# Patient Record
Sex: Female | Born: 1995
Health system: Southern US, Community
[De-identification: ages and names within clinical notes are randomized; demographics above are authoritative.]

## PROBLEM LIST (undated history)

## (undated) DIAGNOSIS — R519 Headache, unspecified: Secondary | ICD-10-CM

## (undated) DIAGNOSIS — R51 Headache: Secondary | ICD-10-CM

## (undated) DIAGNOSIS — R42 Dizziness and giddiness: Secondary | ICD-10-CM

## (undated) DIAGNOSIS — R55 Syncope and collapse: Secondary | ICD-10-CM

## (undated) DIAGNOSIS — R5383 Other fatigue: Secondary | ICD-10-CM

## (undated) HISTORY — DX: Headache, unspecified: R51.9

## (undated) HISTORY — DX: Other fatigue: R53.83

## (undated) HISTORY — DX: Headache: R51

## (undated) HISTORY — DX: Dizziness and giddiness: R42

## (undated) HISTORY — DX: Syncope and collapse: R55

---
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
## 2015-03-20 ENCOUNTER — Ambulatory Visit (INDEPENDENT_AMBULATORY_CARE_PROVIDER_SITE_OTHER): Payer: 59 | Admitting: Diagnostic Neuroimaging

## 2015-03-20 ENCOUNTER — Encounter: Payer: Self-pay | Admitting: Diagnostic Neuroimaging

## 2015-03-20 ENCOUNTER — Encounter (INDEPENDENT_AMBULATORY_CARE_PROVIDER_SITE_OTHER): Payer: Self-pay

## 2015-03-20 VITALS — BP 139/81 | HR 84 | Ht 63.0 in | Wt 182.0 lb

## 2015-03-20 DIAGNOSIS — R55 Syncope and collapse: Secondary | ICD-10-CM | POA: Diagnosis not present

## 2015-03-20 DIAGNOSIS — G43109 Migraine with aura, not intractable, without status migrainosus: Secondary | ICD-10-CM | POA: Diagnosis not present

## 2015-03-20 NOTE — Progress Notes (Signed)
[facility]  PATIENT: Maria Russo DOB: June 06, 1996  REFERRING CLINICIAN: C Fulp HISTORY FROM: patient  REASON FOR VISIT: new consult    HISTORICAL  CHIEF COMPLAINT:  Chief Complaint  Patient presents with  . Advice Only    headaches behind right ear, in the same spot everyday, does not away, syncope    HISTORY OF PRESENT ILLNESS:   19 year old right-handed female here for valuation of headache. Patient developed onset of headaches in March 2016. She describes right-sided gripping shooting pain with phonophobia. No nausea vomiting. She also had an episode when she was at school in her dorm, standing up, when she felt vision changes move from her left to right visual field, like static from an old television, and then fall to the ground. She denies fully losing consciousness but she may have lost vision temporarily. No tongue biting or incontinence or convulsions.  Patient had a similar episode of vision changes and collapse in May 2016, but denies full loss of consciousness.  Patient is having daily gripping headaches on the right side around 3 PM, since March.  No family history of migraine headaches. Patient's mother has benign meningioma.  No specific triggering factors. No change in diet, stress, physical activity, sleep.   REVIEW OF SYSTEMS: Full 14 system review of systems performed and notable only for fatigue spinning sensation headache dizziness passing out allergies.   ALLERGIES: Allergies  Allergen Reactions  . Seasonal Ic [Cholestatin]     HOME MEDICATIONS: Outpatient Prescriptions Prior to Visit  Medication Sig Dispense Refill  . citalopram (CELEXA) 20 MG tablet Take 20 mg by mouth at bedtime and may repeat dose one time if needed.    . traZODone (DESYREL) 50 MG tablet Take 25-50 mg by mouth at bedtime.     No facility-administered medications prior to visit.    PAST MEDICAL HISTORY: Past Medical History  Diagnosis Date  .  Fatigue   . Headache   . Dizziness   . Syncope     PAST SURGICAL HISTORY: History reviewed. No pertinent past surgical history.  FAMILY HISTORY: Family History  Problem Relation Age of Onset  . Diabetes Mother   . Hypertension Father   . Diabetes Maternal Grandmother   . Diabetes Maternal Grandfather   . Diabetes Paternal Grandmother   . Hypertension Paternal Grandmother   . Diabetes Paternal Grandfather   . Hypertension Paternal Grandfather     SOCIAL HISTORY:  History   Social History  . Marital Status: Single    Spouse Name: N/A  . Number of Children: N/A  . Years of Education: N/A   Occupational History  . student     Social History Main Topics  . Smoking status: Never Smoker   . Smokeless tobacco: Never Used  . Alcohol Use: No  . Drug Use: No  . Sexual Activity: No   Other Topics Concern  . Not on file   Social History Narrative   College, home for the summer  Live at home with parents      Sweet Tea every other day      PHYSICAL EXAM  Filed Vitals:   03/20/15 1505  BP: 139/81  Pulse: 84  Height:  (1.6 m)  Weight: 182 lb (82.555 kg)    Body mass index is 32.25 kg/(m^2).   Visual Acuity Screening   Right eye Left eye Both eyes  Without correction:     With correction:   20/40    No flowsheet  data found.  GENERAL EXAM: Patient is in no distress; well developed, nourished and groomed; neck is supple  CARDIOVASCULAR: Regular rate and rhythm, no murmurs, no carotid bruits  NEUROLOGIC: MENTAL STATUS: awake, alert, oriented to person, place and time, recent and remote memory intact, normal attention and concentration, language fluent, comprehension intact, naming intact, fund of knowledge appropriate CRANIAL NERVE: no papilledema on fundoscopic exam, pupils equal and reactive to light, visual fields full to confrontation, extraocular muscles intact, no nystagmus, facial sensation and strength symmetric, hearing intact, palate elevates  symmetrically, uvula midline, shoulder shrug symmetric, tongue midline. MOTOR: normal bulk and tone, full strength in the BUE, BLE SENSORY: normal and symmetric to light touch, temperature, vibration  COORDINATION: finger-nose-finger, fine finger movements normal REFLEXES: deep tendon reflexes present and symmetric GAIT/STATION: narrow based gait; able to walk tandem; romberg is negative    DIAGNOSTIC DATA (LABS, IMAGING, TESTING) - I reviewed patient records, labs, notes, testing and imaging myself where available.  Lab Results  Component Value Date   WBC 10.2 12/08/2011   HGB 15.0 12/08/2011   HCT 44.0 12/08/2011   MCV 80.1 12/08/2011   PLT 194 12/08/2011      Component Value Date/Time   NA 137 12/08/2011 1442   K 3.6 12/08/2011 1442   CL 102 12/08/2011 1442   GLUCOSE 86 12/08/2011 1442   BUN 11 12/08/2011 1442   CREATININE 0.90 12/08/2011 1442   No results found for: CHOL, HDL, LDLCALC, LDLDIRECT, TRIG, CHOLHDL No results found for: [id number] No results found for: VITAMINB12 No results found for: TSH   12/08/11 CT soft tissue neck - No evidence for neck abscess or epiglottitis.    ASSESSMENT AND PLAN  19 y.o. year old female here with new onset headaches, dizziness, vision changes, near syncope.  Ddx: syncope vs migraine vs seizure  PLAN: - MRI brain and EEG; then may consider migraine medications if testing is negative - consider cardiac workup of syncope (will defer to PCP) - caution with driving until testing completed and symptoms resolved  Orders Placed This Encounter  Procedures  . MR Brain W Wo Contrast  . EEG adult   Return in about 1 month (around 04/19/2015).    Suanne MarkerVIKRAM R. Dacotah Cabello, MD 03/20/2015, 3:49 PM Certified in Neurology, Neurophysiology and Neuroimaging  [facility] [address], [address] [place], [place] [address] [phone]

## 2015-03-20 NOTE — Patient Instructions (Signed)
I will check MRI brain and EEG. 

## 2015-03-23 ENCOUNTER — Ambulatory Visit (INDEPENDENT_AMBULATORY_CARE_PROVIDER_SITE_OTHER): Payer: 59 | Admitting: Neurology

## 2015-03-23 ENCOUNTER — Ambulatory Visit (INDEPENDENT_AMBULATORY_CARE_PROVIDER_SITE_OTHER): Payer: 59

## 2015-03-23 DIAGNOSIS — R55 Syncope and collapse: Secondary | ICD-10-CM

## 2015-03-23 DIAGNOSIS — G43109 Migraine with aura, not intractable, without status migrainosus: Secondary | ICD-10-CM

## 2015-03-23 MED ORDER — GADOPENTETATE DIMEGLUMINE 469.01 MG/ML IV SOLN: 18.0000 mL | Freq: Once | INTRAVENOUS | Status: AC | PRN

## 2015-03-23 NOTE — Procedures (Signed)
     History: Maria Russo is a 19 year old patient with a history of daily headaches. The patient has had episodes as well of near-syncope. The patient may fall to the ground, but she does not lose consciousness. The patient is being evaluated for these events.  This is a routine EEG. No skull defects are noted. Medications include Celexa, Flonase, Advil, Depo-Provera, and trazodone.  EEG classification: Dysrhythmia grade 1 left greater than right temporal  Description of the recording: The background rhythms of this recording consists of a relatively well modulated medium amplitude alpha rhythm of 9 Hz that is reactive to eye opening and closure. As the record progresses, photic stimulation is performed, this results in a bilateral and symmetric photic driving response. Hyperventilation is also performed, and this results in a minimal buildup of the background rhythm activities without significant slowing seen. Intermittently during the recording, mild episodes of theta slowing are seen emanating from the temporal regions bilaterally, but are usually more prominent in the left temporal region than in the right. At no time during the recording does there appear to be evidence of actual spike or spike-wave discharges. EKG monitor shows no evidence of cardiac rhythm abnormalities with a heart rate of 66.  Impression: This is a mildly abnormal EEG recording secondary to intermittent left greater than right temporal slowing. 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 Correlation with clinical events is necessary.

## 2015-03-31 ENCOUNTER — Telehealth: Payer: Self-pay | Admitting: *Deleted

## 2015-03-31 ENCOUNTER — Telehealth: Payer: Self-pay

## 2015-03-31 ENCOUNTER — Other Ambulatory Visit: Payer: Self-pay | Admitting: *Deleted

## 2015-03-31 DIAGNOSIS — G43009 Migraine without aura, not intractable, without status migrainosus: Secondary | ICD-10-CM

## 2015-03-31 MED ORDER — TOPIRAMATE 50 MG PO TABS: 50.0000 mg | ORAL_TABLET | Freq: Two times a day (BID) | ORAL | Status: AC

## 2015-03-31 NOTE — Telephone Encounter (Signed)
Patient informed that Topamax prescription is available for her to pick up at the front desk

## 2015-03-31 NOTE — Telephone Encounter (Signed)
Patient called back and stated that she would like to being Rx. TOPAMAX and requested that it be sent to her pharmacy. [id number] - [place], [place] - [address]. No need to return call.

## 2015-03-31 NOTE — Telephone Encounter (Signed)
Called and spoke with the pt on the phone. Explained that her MRI was normal. Also that her EEG showed some mild abnormality but that Dr. Marjory Lies did not want to put her on seizure medication at this time. He also asked me to mention to the pt that she might consider topamax for the migraines. I told her to think about it and let me know and we could get a prescription called into her pharmacy. She thanked me and stated an understanding

## 2015-04-19 ENCOUNTER — Encounter: Payer: Self-pay | Admitting: Diagnostic Neuroimaging

## 2015-04-19 ENCOUNTER — Ambulatory Visit (INDEPENDENT_AMBULATORY_CARE_PROVIDER_SITE_OTHER): Payer: 59 | Admitting: Diagnostic Neuroimaging

## 2015-04-19 VITALS — BP 111/71 | HR 86 | Ht 63.0 in | Wt 177.5 lb

## 2015-04-19 DIAGNOSIS — G43109 Migraine with aura, not intractable, without status migrainosus: Secondary | ICD-10-CM | POA: Diagnosis not present

## 2015-04-19 NOTE — Patient Instructions (Signed)
Continue topiramate  twice a day.

## 2015-04-19 NOTE — Progress Notes (Signed)
[facility]  PATIENT: Maria Russo DOB: Feb 28, 1996  REFERRING CLINICIAN: C Fulp HISTORY FROM: patient  REASON FOR VISIT: follow up   HISTORICAL  CHIEF COMPLAINT:  Chief Complaint  Patient presents with  . Migraine    rm 6, review MRI  . Follow-up    HISTORY OF PRESENT ILLNESS:   UPDATE 04/19/15: Since last visit, HA are improved slightly. Still daily, but less severe. MRI and EEG results reviewed.  PRIOR HPI (03/20/15): 19 year old right-handed female here for evaluation of headache. Patient developed onset of headaches in March 2016. She describes right-sided gripping shooting pain with phonophobia. No nausea vomiting. She also had an episode when she was at school in her dorm, standing up, when she felt vision changes move from her left to right visual field, like static from an old television, and then fall to the ground. She denies fully losing consciousness but she may have lost vision temporarily. No tongue biting or incontinence or convulsions. Patient had a similar episode of vision changes and collapse in May 2016, but denies full loss of consciousness. Patient is having daily gripping headaches on the right side around 3 PM, since March.  No family history of migraine headaches. Patient's mother has benign meningioma. No specific triggering factors. No change in diet, stress, physical activity, sleep.   REVIEW OF SYSTEMS: Full 14 system review of systems performed and notable only for headache.   ALLERGIES: Allergies  Allergen Reactions  . Seasonal Ic [Cholestatin]     Patient denies    HOME MEDICATIONS: Outpatient Prescriptions Prior to Visit  Medication Sig Dispense Refill  . fluticasone (FLONASE) 50 MCG/ACT nasal spray Place 50 sprays into both nostrils daily.  3  . ibuprofen (ADVIL,MOTRIN) 200 MG tablet Take 200 mg by mouth every 6 (six) hours as needed.    . medroxyPROGESTERone (DEPO-PROVERA) 150 MG/ML injection Inject 150 mg into the  muscle every 3 (three) months.    . topiramate (TOPAMAX) 50 MG tablet Take 1 tablet (50 mg total) by mouth 2 (two) times daily. 60 tablet 3  . citalopram (CELEXA) 20 MG tablet Take 20 mg by mouth at bedtime and may repeat dose one time if needed.    . traZODone (DESYREL) 50 MG tablet Take 25-50 mg by mouth at bedtime.     No facility-administered medications prior to visit.    PAST MEDICAL HISTORY: Past Medical History  Diagnosis Date  . Fatigue   . Headache   . Dizziness   . Syncope     PAST SURGICAL HISTORY: No past surgical history on file.  FAMILY HISTORY: Family History  Problem Relation Age of Onset  . Diabetes Mother   . Hypertension Father   . Diabetes Maternal Grandmother   . Diabetes Maternal Grandfather   . Diabetes Paternal Grandmother   . Hypertension Paternal Grandmother   . Diabetes Paternal Grandfather   . Hypertension Paternal Grandfather     SOCIAL HISTORY:  History   Social History  . Marital Status: Single    Spouse Name: N/A  . Number of Children: N/A  . Years of Education: N/A   Occupational History  . student     Social History Main Topics  . Smoking status: Never Smoker   . Smokeless tobacco: Never Used  . Alcohol Use: No  . Drug Use: No  . Sexual Activity: No   Other Topics Concern  . Not on file   Social History Narrative   College, home for the summer  Live at home with parents      Sweet Tea every other day      PHYSICAL EXAM  Filed Vitals:   04/19/15 1315  BP: 111/71  Pulse: 86  Height: 5\' 3"  (1.6 m)  Weight: 177 lb 8 oz (80.513 kg)    Body mass index is 31.45 kg/(m^2).  No exam data present  No flowsheet data found.  GENERAL EXAM: Patient is in no distress; well developed, nourished and groomed; neck is supple  CARDIOVASCULAR: Regular rate and rhythm, no murmurs, no carotid bruits  NEUROLOGIC: MENTAL STATUS: awake, alert, language fluent, comprehension intact, naming intact, fund of knowledge  appropriate CRANIAL NERVE: pupils equal and reactive to light, visual fields full to confrontation, extraocular muscles intact, no nystagmus, facial sensation and strength symmetric, hearing intact, palate elevates symmetrically, uvula midline, shoulder shrug symmetric, tongue midline. MOTOR: normal bulk and tone, full strength in the BUE, BLE SENSORY: normal and symmetric to light touch, temperature, vibration  COORDINATION: finger-nose-finger, fine finger movements normal REFLEXES: deep tendon reflexes present and symmetric GAIT/STATION: narrow based gait; able to walk tandem; romberg is negative    DIAGNOSTIC DATA (LABS, IMAGING, TESTING) - I reviewed patient records, labs, notes, testing and imaging myself where available.  Lab Results  Component Value Date   WBC 10.2 12/08/2011   HGB 15.0 12/08/2011   HCT 44.0 12/08/2011   MCV 80.1 12/08/2011   PLT 194 12/08/2011      Component Value Date/Time   NA 137 12/08/2011 1442   K 3.6 12/08/2011 1442   CL 102 12/08/2011 1442   GLUCOSE 86 12/08/2011 1442   BUN 11 12/08/2011 1442   CREATININE 0.90 12/08/2011 1442   No results found for: CHOL, HDL, LDLCALC, LDLDIRECT, TRIG, CHOLHDL No results found for: [id number] No results found for: VITAMINB12 No results found for: TSH   12/08/11 CT soft tissue neck - No evidence for neck abscess or epiglottitis.  03/23/15 MRI brain - Unremarkable   03/23/15 EEG - intermittent left greater than right temporal slowing. 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 Correlation with clinical events is necessary.      ASSESSMENT AND PLAN  19 y.o. year old female here with new onset headaches, dizziness, vision changes, near syncope.  Ddx: syncope vs migraine  PLAN: I spent 15 minutes of face to face time with patient. Greater than 50% of time was spent in counseling and coordination of care with patient. In summary we discussed:  - continue TPX 50mg   BID for migraine prevention  Return in about 3 months (around 07/20/2015).    Suanne MarkerVIKRAM R. Alynna Hargrove, MD 04/19/2015, 1:56 PM Certified in Neurology, Neurophysiology and Neuroimaging  [facility] [address], [address] [place], [place] [address] [phone]

## 2015-08-01 ENCOUNTER — Ambulatory Visit: Payer: 59 | Admitting: Diagnostic Neuroimaging

## 2016-05-16 ENCOUNTER — Other Ambulatory Visit: Payer: Self-pay | Admitting: Orthopedic Surgery

## 2016-10-01 ENCOUNTER — Ambulatory Visit (HOSPITAL_BASED_OUTPATIENT_CLINIC_OR_DEPARTMENT_OTHER): Admit: 2016-10-01 | Payer: Self-pay | Admitting: Orthopedic Surgery

## 2016-10-01 ENCOUNTER — Encounter (HOSPITAL_BASED_OUTPATIENT_CLINIC_OR_DEPARTMENT_OTHER): Payer: Self-pay

## 2016-10-01 SURGERY — EXCISION MASS
Anesthesia: Choice | Laterality: Right

## 2017-07-22 DIAGNOSIS — N920 Excessive and frequent menstruation with regular cycle: Secondary | ICD-10-CM | POA: Diagnosis not present

## 2017-07-22 DIAGNOSIS — N946 Dysmenorrhea, unspecified: Secondary | ICD-10-CM | POA: Diagnosis not present

## 2017-10-15 ENCOUNTER — Other Ambulatory Visit: Payer: Self-pay | Admitting: Obstetrics and Gynecology

## 2017-10-15 ENCOUNTER — Other Ambulatory Visit (HOSPITAL_COMMUNITY)
Admission: RE | Admit: 2017-10-15 | Discharge: 2017-10-15 | Disposition: A | Discharge status: Home / Self Care | Payer: 59 | Source: Ambulatory Visit | Attending: Obstetrics and Gynecology | Admitting: Obstetrics and Gynecology

## 2017-10-15 DIAGNOSIS — Z01419 Encounter for gynecological examination (general) (routine) without abnormal findings: Secondary | ICD-10-CM | POA: Insufficient documentation

## 2017-10-16 LAB — CYTOLOGY - PAP: Diagnosis: NEGATIVE

## 2017-10-23 DIAGNOSIS — K090 Developmental odontogenic cysts: Secondary | ICD-10-CM | POA: Diagnosis not present

## 2017-12-22 DIAGNOSIS — Z131 Encounter for screening for diabetes mellitus: Secondary | ICD-10-CM | POA: Diagnosis not present

## 2017-12-22 DIAGNOSIS — D649 Anemia, unspecified: Secondary | ICD-10-CM | POA: Diagnosis not present

## 2017-12-22 DIAGNOSIS — J309 Allergic rhinitis, unspecified: Secondary | ICD-10-CM | POA: Diagnosis not present

## 2017-12-22 DIAGNOSIS — N920 Excessive and frequent menstruation with regular cycle: Secondary | ICD-10-CM | POA: Diagnosis not present

## 2018-03-23 DIAGNOSIS — Z3042 Encounter for surveillance of injectable contraceptive: Secondary | ICD-10-CM | POA: Diagnosis not present

## 2018-04-15 DIAGNOSIS — B360 Pityriasis versicolor: Secondary | ICD-10-CM | POA: Diagnosis not present

## 2018-06-10 DIAGNOSIS — Z3042 Encounter for surveillance of injectable contraceptive: Secondary | ICD-10-CM | POA: Diagnosis not present

## 2018-08-05 DIAGNOSIS — Z23 Encounter for immunization: Secondary | ICD-10-CM | POA: Diagnosis not present

## 2018-08-27 DIAGNOSIS — Z3042 Encounter for surveillance of injectable contraceptive: Secondary | ICD-10-CM | POA: Diagnosis not present

## 2018-10-19 ENCOUNTER — Other Ambulatory Visit (HOSPITAL_COMMUNITY)
Admission: RE | Admit: 2018-10-19 | Discharge: 2018-10-19 | Disposition: A | Discharge status: Home / Self Care | Payer: 59 | Source: Ambulatory Visit | Attending: Obstetrics and Gynecology | Admitting: Obstetrics and Gynecology

## 2018-10-19 ENCOUNTER — Other Ambulatory Visit: Payer: Self-pay | Admitting: Obstetrics and Gynecology

## 2018-10-19 DIAGNOSIS — Z01419 Encounter for gynecological examination (general) (routine) without abnormal findings: Secondary | ICD-10-CM | POA: Diagnosis present

## 2018-10-20 LAB — CYTOLOGY - PAP
Chlamydia: NEGATIVE
DIAGNOSIS: NEGATIVE
NEISSERIA GONORRHEA: NEGATIVE

## 2018-11-20 DIAGNOSIS — Z3042 Encounter for surveillance of injectable contraceptive: Secondary | ICD-10-CM | POA: Diagnosis not present

## 2019-02-09 DIAGNOSIS — Z3042 Encounter for surveillance of injectable contraceptive: Secondary | ICD-10-CM | POA: Diagnosis not present

## 2019-12-24 ENCOUNTER — Ambulatory Visit: Payer: 59 | Attending: Internal Medicine

## 2019-12-24 DIAGNOSIS — Z23 Encounter for immunization: Secondary | ICD-10-CM

## 2019-12-24 NOTE — Progress Notes (Signed)
   Covid-19 Vaccination Clinic  Name:  DONABELLE MOLDEN    MRN: [id number] DOB: [birth year]  12/24/2019  Ms. Denman was observed post Covid-19 immunization for 15 minutes without incident. She was provided with Vaccine Information Sheet and instruction to access the V-Safe system.   Ms. Carreon was instructed to call 911 with any severe reactions post vaccine: Marland Kitchen Difficulty breathing  . Swelling of face and throat  . A fast heartbeat  . A bad rash all over body  . Dizziness and weakness   Immunizations Administered    Name Date Dose VIS Date Route   Pfizer COVID-19 Vaccine 12/24/2019 10:48 AM 0.3 mL 09/24/2019 Intramuscular   Manufacturer: [employer], [employer]   Lot: [id number]   NDC: [address]-7

## 2020-01-18 ENCOUNTER — Ambulatory Visit: Payer: 59 | Attending: Internal Medicine

## 2020-01-18 DIAGNOSIS — Z23 Encounter for immunization: Secondary | ICD-10-CM

## 2020-01-18 NOTE — Progress Notes (Signed)
   Covid-19 Vaccination Clinic  Name:  Maria Russo    MRN: [id number] DOB: [birth year]  01/18/2020  [name] was observed post Covid-19 immunization for 15 minutes without incident. She was provided with Vaccine Information Sheet and instruction to access the V-Safe system.   [name] was instructed to call 911 with any severe reactions post vaccine: Marland Kitchen Difficulty breathing  . Swelling of face and throat  . A fast heartbeat  . A bad rash all over body  . Dizziness and weakness   Immunizations Administered    Name Date Dose VIS Date Route   Pfizer COVID-19 Vaccine 01/18/2020  9:04 AM 0.3 mL 09/24/2019 Intramuscular   Manufacturer: [employer], [employer]   Lot: [id number]   NDC: [address]-9

## 2021-02-16 ENCOUNTER — Other Ambulatory Visit (HOSPITAL_COMMUNITY): Payer: Self-pay

## 2021-02-16 MED ORDER — MEDROXYPROGESTERONE ACETATE 150 MG/ML IM SUSP
INTRAMUSCULAR | 2 refills | Status: AC
  Filled 2021-02-16: qty 1, 90d supply, fill #0

## 2021-06-06 DIAGNOSIS — F411 Generalized anxiety disorder: Secondary | ICD-10-CM | POA: Diagnosis not present

## 2021-06-25 DIAGNOSIS — F411 Generalized anxiety disorder: Secondary | ICD-10-CM | POA: Diagnosis not present

## 2021-07-17 DIAGNOSIS — Z3042 Encounter for surveillance of injectable contraceptive: Secondary | ICD-10-CM | POA: Diagnosis not present

## 2021-07-25 DIAGNOSIS — F411 Generalized anxiety disorder: Secondary | ICD-10-CM | POA: Diagnosis not present

## 2021-07-30 DIAGNOSIS — R1084 Generalized abdominal pain: Secondary | ICD-10-CM | POA: Diagnosis not present

## 2021-08-20 DIAGNOSIS — F331 Major depressive disorder, recurrent, moderate: Secondary | ICD-10-CM | POA: Diagnosis not present

## 2021-08-20 DIAGNOSIS — F411 Generalized anxiety disorder: Secondary | ICD-10-CM | POA: Diagnosis not present

## 2021-09-05 DIAGNOSIS — F411 Generalized anxiety disorder: Secondary | ICD-10-CM | POA: Diagnosis not present

## 2021-09-05 DIAGNOSIS — F331 Major depressive disorder, recurrent, moderate: Secondary | ICD-10-CM | POA: Diagnosis not present

## 2021-10-04 DIAGNOSIS — F331 Major depressive disorder, recurrent, moderate: Secondary | ICD-10-CM | POA: Diagnosis not present

## 2021-10-04 DIAGNOSIS — F411 Generalized anxiety disorder: Secondary | ICD-10-CM | POA: Diagnosis not present

## 2021-10-12 DIAGNOSIS — Z3042 Encounter for surveillance of injectable contraceptive: Secondary | ICD-10-CM | POA: Diagnosis not present

## 2021-10-25 DIAGNOSIS — F331 Major depressive disorder, recurrent, moderate: Secondary | ICD-10-CM | POA: Diagnosis not present

## 2021-10-25 DIAGNOSIS — F411 Generalized anxiety disorder: Secondary | ICD-10-CM | POA: Diagnosis not present

## 2021-11-15 DIAGNOSIS — F331 Major depressive disorder, recurrent, moderate: Secondary | ICD-10-CM | POA: Diagnosis not present

## 2021-11-15 DIAGNOSIS — F411 Generalized anxiety disorder: Secondary | ICD-10-CM | POA: Diagnosis not present

## 2021-12-10 ENCOUNTER — Other Ambulatory Visit: Payer: Self-pay | Admitting: Obstetrics and Gynecology

## 2021-12-10 ENCOUNTER — Other Ambulatory Visit (HOSPITAL_COMMUNITY)
Admission: RE | Admit: 2021-12-10 | Discharge: 2021-12-10 | Disposition: A | Discharge status: Home / Self Care | Payer: BC Managed Care – PPO | Source: Ambulatory Visit | Attending: Obstetrics and Gynecology | Admitting: Obstetrics and Gynecology

## 2021-12-10 DIAGNOSIS — Z01419 Encounter for gynecological examination (general) (routine) without abnormal findings: Secondary | ICD-10-CM | POA: Diagnosis not present

## 2021-12-12 LAB — CYTOLOGY - PAP: Diagnosis: NEGATIVE

## 2021-12-13 DIAGNOSIS — F331 Major depressive disorder, recurrent, moderate: Secondary | ICD-10-CM | POA: Diagnosis not present

## 2021-12-13 DIAGNOSIS — F411 Generalized anxiety disorder: Secondary | ICD-10-CM | POA: Diagnosis not present

## 2023-05-09 ENCOUNTER — Other Ambulatory Visit: Payer: Self-pay

## 2023-05-09 DIAGNOSIS — Z3202 Encounter for pregnancy test, result negative: Secondary | ICD-10-CM | POA: Diagnosis not present

## 2023-05-09 DIAGNOSIS — Z30013 Encounter for initial prescription of injectable contraceptive: Secondary | ICD-10-CM | POA: Diagnosis not present

## 2023-05-09 DIAGNOSIS — N921 Excessive and frequent menstruation with irregular cycle: Secondary | ICD-10-CM | POA: Diagnosis not present

## 2023-05-09 DIAGNOSIS — Z3042 Encounter for surveillance of injectable contraceptive: Secondary | ICD-10-CM | POA: Diagnosis not present

## 2023-05-09 MED ORDER — MEDROXYPROGESTERONE ACETATE 150 MG/ML IM SUSY
1.0000 mL | PREFILLED_SYRINGE | INTRAMUSCULAR | 1 refills | Status: AC
  Filled 2023-05-09: qty 1, 90d supply, fill #0
  Filled 2023-07-25: qty 1, 90d supply, fill #1

## 2023-05-19 DIAGNOSIS — N921 Excessive and frequent menstruation with irregular cycle: Secondary | ICD-10-CM | POA: Diagnosis not present

## 2023-07-25 ENCOUNTER — Other Ambulatory Visit: Payer: Self-pay

## 2023-07-25 DIAGNOSIS — Z3042 Encounter for surveillance of injectable contraceptive: Secondary | ICD-10-CM | POA: Diagnosis not present

## 2023-09-26 ENCOUNTER — Other Ambulatory Visit (HOSPITAL_BASED_OUTPATIENT_CLINIC_OR_DEPARTMENT_OTHER): Payer: Self-pay

## 2023-10-10 ENCOUNTER — Other Ambulatory Visit: Payer: Self-pay

## 2023-11-06 ENCOUNTER — Other Ambulatory Visit (HOSPITAL_COMMUNITY)
Admission: RE | Admit: 2023-11-06 | Discharge: 2023-11-06 | Disposition: A | Discharge status: Home / Self Care | Payer: 59 | Source: Ambulatory Visit | Attending: Obstetrics and Gynecology | Admitting: Obstetrics and Gynecology

## 2023-11-06 ENCOUNTER — Other Ambulatory Visit: Payer: Self-pay | Admitting: Obstetrics and Gynecology

## 2023-11-06 DIAGNOSIS — Z01419 Encounter for gynecological examination (general) (routine) without abnormal findings: Secondary | ICD-10-CM | POA: Insufficient documentation

## 2023-11-10 LAB — CYTOLOGY - PAP: Diagnosis: NEGATIVE
